# Patient Record
Sex: Male | Born: 1935 | Race: White | Hispanic: No | Marital: Married | Smoking: Never smoker
Health system: Southern US, Community
[De-identification: ages and names within clinical notes are randomized; demographics above are authoritative.]

## PROBLEM LIST (undated history)

## (undated) DIAGNOSIS — Z8546 Personal history of malignant neoplasm of prostate: Secondary | ICD-10-CM

## (undated) DIAGNOSIS — M1990 Unspecified osteoarthritis, unspecified site: Secondary | ICD-10-CM

## (undated) DIAGNOSIS — Z5189 Encounter for other specified aftercare: Secondary | ICD-10-CM

## (undated) DIAGNOSIS — G4730 Sleep apnea, unspecified: Secondary | ICD-10-CM

## (undated) DIAGNOSIS — H40059 Ocular hypertension, unspecified eye: Secondary | ICD-10-CM

## (undated) DIAGNOSIS — E079 Disorder of thyroid, unspecified: Secondary | ICD-10-CM

## (undated) DIAGNOSIS — T7840XA Allergy, unspecified, initial encounter: Secondary | ICD-10-CM

## (undated) DIAGNOSIS — G2581 Restless legs syndrome: Secondary | ICD-10-CM

## (undated) HISTORY — DX: Encounter for other specified aftercare: Z51.89

## (undated) HISTORY — DX: Personal history of malignant neoplasm of prostate: Z85.46

## (undated) HISTORY — DX: Restless legs syndrome: G25.81

## (undated) HISTORY — DX: Sleep apnea, unspecified: G47.30

## (undated) HISTORY — DX: Ocular hypertension, unspecified eye: H40.059

## (undated) HISTORY — DX: Allergy, unspecified, initial encounter: T78.40XA

## (undated) HISTORY — DX: Unspecified osteoarthritis, unspecified site: M19.90

## (undated) HISTORY — DX: Disorder of thyroid, unspecified: E07.9

---
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
## 2004-04-19 HISTORY — PX: BACK SURGERY: SHX140

## 2004-05-12 ENCOUNTER — Ambulatory Visit (HOSPITAL_COMMUNITY): Admission: RE | Admit: 2004-05-12 | Discharge: 2004-05-12 | Payer: Self-pay | Admitting: Neurosurgery

## 2006-04-19 DIAGNOSIS — Z8546 Personal history of malignant neoplasm of prostate: Secondary | ICD-10-CM

## 2006-04-19 HISTORY — DX: Personal history of malignant neoplasm of prostate: Z85.46

## 2006-04-19 HISTORY — PX: PROSTATECTOMY: SHX69

## 2007-04-20 HISTORY — PX: ROTATOR CUFF REPAIR: SHX139

## 2010-09-10 ENCOUNTER — Encounter: Payer: Self-pay | Admitting: Gastroenterology

## 2011-11-16 ENCOUNTER — Encounter: Payer: Self-pay | Admitting: Gastroenterology

## 2011-12-22 ENCOUNTER — Encounter: Payer: Self-pay | Admitting: Gastroenterology

## 2011-12-27 ENCOUNTER — Ambulatory Visit (AMBULATORY_SURGERY_CENTER): Payer: Medicare Other | Admitting: *Deleted

## 2011-12-27 VITALS — Ht 64.0 in | Wt 175.3 lb

## 2011-12-27 DIAGNOSIS — Z1211 Encounter for screening for malignant neoplasm of colon: Secondary | ICD-10-CM

## 2011-12-27 MED ORDER — MOVIPREP 100 G PO SOLR: ORAL | Status: DC

## 2012-01-10 ENCOUNTER — Encounter: Payer: Self-pay | Admitting: Gastroenterology

## 2012-01-10 ENCOUNTER — Ambulatory Visit (AMBULATORY_SURGERY_CENTER): Payer: Medicare Other | Admitting: Gastroenterology

## 2012-01-10 VITALS — BP 138/74 | HR 62 | Temp 97.3°F | Resp 16 | Ht 64.0 in | Wt 175.0 lb

## 2012-01-10 DIAGNOSIS — Z1211 Encounter for screening for malignant neoplasm of colon: Secondary | ICD-10-CM

## 2012-01-10 MED ORDER — SODIUM CHLORIDE 0.9 % IV SOLN: 500.0000 mL | INTRAVENOUS | Status: DC

## 2012-01-10 NOTE — Patient Instructions (Signed)
YOU HAD AN ENDOSCOPIC PROCEDURE TODAY AT THE [facility]: Refer to the procedure report that was given to you for any specific questions about what was found during the examination.  If the procedure report does not answer your questions, please call your gastroenterologist to clarify.  If you requested that your care partner not be given the details of your procedure findings, then the procedure report has been included in a sealed envelope for you to review at your convenience later.  YOU SHOULD EXPECT: Some feelings of bloating in the abdomen. Passage of more gas than usual.  Walking can help get rid of the air that was put into your GI tract during the procedure and reduce the bloating. If you had a lower endoscopy (such as a colonoscopy or flexible sigmoidoscopy) you may notice spotting of blood in your stool or on the toilet paper. If you underwent a bowel prep for your procedure, then you may not have a normal bowel movement for a few days.  DIET: Your first meal following the procedure should be a light meal and then it is ok to progress to your normal diet.  A half-sandwich or bowl of soup is an example of a good first meal.  Heavy or fried foods are harder to digest and may make you feel nauseous or bloated.  Likewise meals heavy in dairy and vegetables can cause extra gas to form and this can also increase the bloating.  Drink plenty of fluids but you should avoid alcoholic beverages for 24 hours.  ACTIVITY: Your care partner should take you home directly after the procedure.  You should plan to take it easy, moving slowly for the rest of the day.  You can resume normal activity the day after the procedure however you should NOT DRIVE or use heavy machinery for 24 hours (because of the sedation medicines used during the test).    SYMPTOMS TO REPORT IMMEDIATELY: A gastroenterologist can be reached at any hour.  During normal business hours, 8:30 AM to 5:00 PM Monday through Friday,  call [phone].  After hours and on weekends, please call the GI answering service at [phone] who will take a message and have the physician on call contact you.   Following lower endoscopy (colonoscopy or flexible sigmoidoscopy):  Excessive amounts of blood in the stool  Significant tenderness or worsening of abdominal pains  Swelling of the abdomen that is new, acute  Fever of 100F or higher    FOLLOW UP: If any biopsies were taken you will be contacted by phone or by letter within the next 1-3 weeks.  Call your gastroenterologist if you have not heard about the biopsies in 3 weeks.  Our staff will call the home number listed on your records the next business day following your procedure to check on you and address any questions or concerns that you may have at that time regarding the information given to you following your procedure. This is a courtesy call and so if there is no answer at the home number and we have not heard from you through the emergency physician on call, we will assume that you have returned to your regular daily activities without incident.  SIGNATURES/CONFIDENTIALITY: You and/or your care partner have signed paperwork which will be entered into your electronic medical record.  These signatures attest to the fact that that the information above on your After Visit Summary has been reviewed and is understood.  Full responsibility of the confidentiality   of this discharge information lies with you and/or your care-partner.     

## 2012-01-10 NOTE — Progress Notes (Signed)
Patient did not experience any of the following events: a burn prior to discharge; a fall within the facility; wrong site/side/patient/procedure/implant event; or a hospital transfer or hospital admission upon discharge from the facility. (G8907) Patient did not have preoperative order for IV antibiotic SSI prophylaxis. (G8918)  

## 2012-01-10 NOTE — Op Note (Signed)
[facility] [address] [place], [address]   COLONOSCOPY PROCEDURE REPORT  PATIENT: Guy Campos, Guy Campos  MR#: [id number] BIRTHDATE: 1934-02-06 , 78  yrs. old GENDER: Male ENDOSCOPIST: Mardella Layman, MD, Clementeen Graham REFERRED BY:  Synetta Fail, M.D. PROCEDURE DATE:  01/10/2012 PROCEDURE:   Colonoscopy, diagnostic ASA CLASS:   Class II INDICATIONS:average risk patient for colon cancer. MEDICATIONS: propofol (Diprivan) 150mg  IV  DESCRIPTION OF PROCEDURE:   After the risks and benefits and of the procedure were explained, informed consent was obtained.  A digital rectal exam revealed no abnormalities of the rectum.    The LB [id number]  endoscope was introduced through the anus and advanced to the cecum, which was identified by both the appendix and ileocecal valve .  The quality of the prep was good, using MoviPrep .  The instrument was then slowly withdrawn as the colon was fully examined.     COLON FINDINGS: A normal appearing cecum, ileocecal valve, and appendiceal orifice were identified.  The ascending, hepatic flexure, transverse, splenic flexure, descending, sigmoid colon and rectum appeared unremarkable.  No polyps or cancers were seen. Mild diverticulosis was noted in the descending colon and sigmoid colon.     Retroflexed views revealed no abnormalities.     The scope was then withdrawn from the patient and the procedure completed.  COMPLICATIONS: There were no complications. ENDOSCOPIC IMPRESSION: 1.   Normal colon 2.   Mild diverticulosis was noted in the descending colon and sigmoid colon  RECOMMENDATIONS: Given your age, you will not need another colonoscopy for colon cancer screening or polyp surveillance.  These types of tests usually stop around the age 70.   REPEAT EXAM:  cc:  _______________________________ eSignedMardella Layman, MD, [facility] 01/10/2012 10:51 AM

## 2012-01-11 ENCOUNTER — Telehealth: Payer: Self-pay | Admitting: *Deleted

## 2012-01-11 NOTE — Telephone Encounter (Signed)
  Follow up Call-  Call back number 01/10/2012  Post procedure Call Back phone  # [phone]  Permission to leave phone message Yes     Patient questions:  Do you have a fever, pain , or abdominal swelling? no Pain Score  0 *  Have you tolerated food without any problems? yes  Have you been able to return to your normal activities? yes  Do you have any questions about your discharge instructions: Diet   no Medications  no Follow up visit  no  Do you have questions or concerns about your Care? no  Actions: * If pain score is 4 or above: No action needed, pain <4.

## 2013-10-05 ENCOUNTER — Encounter (HOSPITAL_BASED_OUTPATIENT_CLINIC_OR_DEPARTMENT_OTHER): Payer: Self-pay | Admitting: Emergency Medicine

## 2013-10-05 ENCOUNTER — Emergency Department (HOSPITAL_BASED_OUTPATIENT_CLINIC_OR_DEPARTMENT_OTHER)
Admission: EM | Admit: 2013-10-05 | Discharge: 2013-10-05 | Disposition: A | Discharge status: Home / Self Care | Payer: Medicare Other | Attending: Emergency Medicine | Admitting: Emergency Medicine

## 2013-10-05 DIAGNOSIS — Z23 Encounter for immunization: Secondary | ICD-10-CM | POA: Insufficient documentation

## 2013-10-05 DIAGNOSIS — Z791 Long term (current) use of non-steroidal anti-inflammatories (NSAID): Secondary | ICD-10-CM | POA: Insufficient documentation

## 2013-10-05 DIAGNOSIS — Y9289 Other specified places as the place of occurrence of the external cause: Secondary | ICD-10-CM | POA: Insufficient documentation

## 2013-10-05 DIAGNOSIS — S61209A Unspecified open wound of unspecified finger without damage to nail, initial encounter: Secondary | ICD-10-CM | POA: Insufficient documentation

## 2013-10-05 DIAGNOSIS — Y9389 Activity, other specified: Secondary | ICD-10-CM | POA: Insufficient documentation

## 2013-10-05 DIAGNOSIS — E079 Disorder of thyroid, unspecified: Secondary | ICD-10-CM | POA: Insufficient documentation

## 2013-10-05 DIAGNOSIS — G4730 Sleep apnea, unspecified: Secondary | ICD-10-CM | POA: Insufficient documentation

## 2013-10-05 DIAGNOSIS — Z8546 Personal history of malignant neoplasm of prostate: Secondary | ICD-10-CM | POA: Insufficient documentation

## 2013-10-05 DIAGNOSIS — S6992XA Unspecified injury of left wrist, hand and finger(s), initial encounter: Secondary | ICD-10-CM

## 2013-10-05 DIAGNOSIS — Z7982 Long term (current) use of aspirin: Secondary | ICD-10-CM | POA: Insufficient documentation

## 2013-10-05 DIAGNOSIS — W268XXA Contact with other sharp object(s), not elsewhere classified, initial encounter: Secondary | ICD-10-CM | POA: Insufficient documentation

## 2013-10-05 DIAGNOSIS — M129 Arthropathy, unspecified: Secondary | ICD-10-CM | POA: Insufficient documentation

## 2013-10-05 DIAGNOSIS — Z8669 Personal history of other diseases of the nervous system and sense organs: Secondary | ICD-10-CM | POA: Insufficient documentation

## 2013-10-05 DIAGNOSIS — Z9981 Dependence on supplemental oxygen: Secondary | ICD-10-CM | POA: Insufficient documentation

## 2013-10-05 MED ORDER — TETANUS-DIPHTH-ACELL PERTUSSIS 5-2.5-18.5 LF-MCG/0.5 IM SUSP
0.5000 mL | Freq: Once | INTRAMUSCULAR | Status: AC
  Administered 2013-10-05: 0.5 mL via INTRAMUSCULAR
  Filled 2013-10-05: qty 0.5

## 2013-10-05 NOTE — ED Provider Notes (Signed)
Medical screening examination/treatment/procedure(s) were performed by non-physician practitioner and as supervising physician I was immediately available for consultation/collaboration.    Nelia Shiobert L Beaton, MD 10/05/13 2258

## 2013-10-05 NOTE — Discharge Instructions (Signed)
Fish Hook Removal °A fish hook can cause a small cut or lesion that extends through all layers of the skin and into the subcutaneous tissue. Because of this, bacteria may get injected beneath the surface of the skin. A simple bandage (dressing) may be applied. This should be changed daily. Follow your caregiver's instructions regarding use of any antibacterial ointments.  °Only take over-the-counter or prescription medicines for pain, discomfort, or fever as directed by your caregiver. °If you did not receive a tetanus shot from your caregiver because you did not recall when your last one was given, make sure to check with your physician's office and determine if one is needed. Generally for a "dirty" wound, you should receive a tetanus booster if you have not had one in the last five years. If you have a "clean" wound, you should receive a tetanus booster if you have not had one within the last ten years. °SEEK IMMEDIATE MEDICAL CARE IF:  °· You develop redness, swelling, or increasing pain in the wound. °· You have a fever. °· You notice a bad smell coming from the wound or dressing. °· You notice pus or other unusual drainage coming from the wound. °Document Released: 04/02/2000 Document Revised: 06/28/2011 Document Reviewed: 10/24/2008 °ExitCare® Patient Information ©2015 [employer]. This information is not intended to replace advice given to you by your health care provider. Make sure you discuss any questions you have with your health care provider. ° ° °

## 2013-10-05 NOTE — ED Provider Notes (Signed)
CSN: [id number]     Arrival date & time 10/05/13  1728 History   First MD Initiated Contact with Patient 10/05/13 1739     Chief Complaint  Patient presents with  . Foreign Body in Skin     (Consider location/radiation/quality/duration/timing/severity/associated sxs/prior Treatment) Patient is a 80 y.o. male presenting with foreign body. The history is provided by the patient. No language interpreter was used.  Foreign Body Location:  Skin Suspected object: fishhook. Pain quality:  Aching Pain severity:  No pain Timing:  Constant Progression:  Worsening Chronicity:  New Worsened by:  Nothing tried Ineffective treatments:  None tried   Past Medical History  Diagnosis Date  . Allergy   . Blood transfusion   . Arthritis   . Sleep apnea     cpap  . Cataract   . History of prostate cancer 2008  . Thyroid disease   . Restless leg syndrome   . Increased pressure in the eye    Past Surgical History  Procedure Laterality Date  . Tonsillectomy  1946  . Total thyroidectomy  1952  . Back surgery  2006  . Prostatectomy  2008    DeVinci  . Rotator cuff repair  2009    right   Family History  Problem Relation Age of Onset  . Colon cancer Neg Hx    History  Substance Use Topics  . Smoking status: Never Smoker   . Smokeless tobacco: Never Used  . Alcohol Use: Yes     Comment: maybe once month of wine    Review of Systems  Skin: Positive for wound.  All other systems reviewed and are negative.     Allergies  Review of patient's allergies indicates no known allergies.  Home Medications   Prior to Admission medications   Medication Sig Start Date End Date Taking? Authorizing Provider  Ascorbic Acid (VITAMIN C) 1000 MG tablet Take 1,000 mg by mouth daily.    Historical Provider, MD  aspirin 81 MG tablet Take 81 mg by mouth daily.    Historical Provider, MD  calcium carbonate (OS-CAL) 600 MG TABS Take 600 mg by mouth daily.    Historical Provider, MD   cholecalciferol (VITAMIN D) 1000 UNITS tablet Take 1,000 Units by mouth daily.    Historical Provider, MD  Coenzyme Q10 (CO Q-10 PO) Take 1 capsule by mouth daily.    Historical Provider, MD  CRESTOR 10 MG tablet Take 1 tablet by mouth Daily. 10/18/11   Historical Provider, MD  fexofenadine (ALLEGRA) 60 MG tablet Take 60 mg by mouth 2 (two) times daily.    Historical Provider, MD  fluticasone (FLONASE) 50 MCG/ACT nasal spray Place 2 sprays into both nostrils At bedtime. 12/24/11   Historical Provider, MD  Multiple Vitamin (MULTIVITAMIN) tablet Take 1 tablet by mouth daily.    Historical Provider, MD  Multiple Vitamins-Minerals (OCUVITE PRESERVISION PO) Take 1 capsule by mouth daily.    Historical Provider, MD  nabumetone (RELAFEN) 750 MG tablet Take 2 tablets by mouth Daily. 10/18/11   Historical Provider, MD  omega-3 fish oil (MAXEPA) 1000 MG CAPS capsule Take 1 capsule by mouth daily.    Historical Provider, MD  PATADAY 0.2 % SOLN Place 1 drop into both eyes Daily. 12/24/11   Historical Provider, MD  rOPINIRole (REQUIP) 1 MG tablet Take 1 mg by mouth 2 (two) times daily. [facility]    Historical Provider, MD  rOPINIRole (REQUIP) 2 MG tablet Take 2 mg by mouth at  bedtime.    Historical Provider, MD  SYNTHROID 112 MCG tablet Take 1 tablet by mouth Daily. 10/18/11   Historical Provider, MD  timolol (TIMOPTIC-XR) 0.5 % ophthalmic gel-forming Place 1 drop into both eyes Daily. 12/24/11   Historical Provider, MD  traMADol (ULTRAM) 50 MG tablet Take 1-2 tablets by mouth Three times daily as needed. 12/24/11   Historical Provider, MD  zolpidem (AMBIEN) 10 MG tablet Take 1 tablet by mouth as needed. 10/18/11   Historical Provider, MD   BP 142/75  Pulse 77  Temp(Src) 98 F (36.7 C) (Oral)  Resp 18  Ht 5' 4.5" (1.638 m)  Wt 175 lb (79.379 kg)  BMI 29.59 kg/m2  SpO2 98% Physical Exam  Constitutional: He appears well-developed and well-nourished.  Musculoskeletal:  Fish hook left 3rd finger  Neurological:  He is alert.  Skin: Skin is warm.  Psychiatric: He has a normal mood and affect.    ED Course  FOREIGN BODY REMOVAL Date/Time: 10/05/2013 6:12 PM Performed by: Elson AreasSOFIA, LESLIE K Authorized by: Elson AreasSOFIA, LESLIE K Consent: Verbal consent not obtained. Risks and benefits: risks, benefits and alternatives were discussed Consent given by: patient Required items: required blood products, implants, devices, and special equipment available Time out: Immediately prior to procedure a "time out" was called to verify the correct patient, procedure, equipment, support staff and site/side marked as required. Body area: skin Anesthesia: local infiltration Patient sedated: no 1 objects recovered. Objects recovered: 1 Patient tolerance: Patient tolerated the procedure well with no immediate complications. Comments: Fish hook removed  Pushed down and pulled barb to release   (including critical care time) Labs Review Labs Reviewed - No data to display  Imaging Review No results found.   EKG Interpretation None      MDM   Final diagnoses:  Fish hook injury of finger, left, initial encounter     Tetanus, bandage   Elson AreasLeslie K Sofia, PA-C 10/05/13 1815

## 2013-10-05 NOTE — ED Notes (Signed)
Fish hook in his left 3rd digit this am. He has tried to remove it without success.

## 2017-01-18 ENCOUNTER — Institutional Professional Consult (permissible substitution): Payer: Self-pay | Admitting: Neurology
